# Patient Record
Sex: Female | Born: 2000
Health system: Southern US, Community
[De-identification: ages and names within clinical notes are randomized; demographics above are authoritative.]

## PROBLEM LIST (undated history)

## (undated) DIAGNOSIS — F419 Anxiety disorder, unspecified: Secondary | ICD-10-CM

## (undated) DIAGNOSIS — F909 Attention-deficit hyperactivity disorder, unspecified type: Secondary | ICD-10-CM

## (undated) DIAGNOSIS — R488 Other symbolic dysfunctions: Secondary | ICD-10-CM

## (undated) DIAGNOSIS — R278 Other lack of coordination: Secondary | ICD-10-CM

## (undated) DIAGNOSIS — R519 Headache, unspecified: Secondary | ICD-10-CM

## (undated) DIAGNOSIS — F32A Depression, unspecified: Secondary | ICD-10-CM

## (undated) DIAGNOSIS — R51 Headache: Secondary | ICD-10-CM

## (undated) HISTORY — DX: Headache, unspecified: R51.9

## (undated) HISTORY — PX: TONSILLECTOMY AND ADENOIDECTOMY: SUR1326

## (undated) HISTORY — DX: Anxiety disorder, unspecified: F41.9

## (undated) HISTORY — DX: Other symbolic dysfunctions: R48.8

## (undated) HISTORY — DX: Depression, unspecified: F32.A

## (undated) HISTORY — DX: Other lack of coordination: R27.8

## (undated) HISTORY — PX: APPENDECTOMY: SHX54

## (undated) HISTORY — DX: Headache: R51

## (undated) HISTORY — DX: Attention-deficit hyperactivity disorder, unspecified type: F90.9

---
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
## 2022-01-26 ENCOUNTER — Telehealth: Payer: 59 | Admitting: Physician Assistant

## 2022-01-26 DIAGNOSIS — B9689 Other specified bacterial agents as the cause of diseases classified elsewhere: Secondary | ICD-10-CM

## 2022-01-26 DIAGNOSIS — J019 Acute sinusitis, unspecified: Secondary | ICD-10-CM | POA: Diagnosis not present

## 2022-01-26 MED ORDER — AMOXICILLIN-POT CLAVULANATE 875-125 MG PO TABS: 1.0000 | ORAL_TABLET | Freq: Two times a day (BID) | ORAL | 0 refills | Status: DC

## 2022-01-26 NOTE — Patient Instructions (Signed)
[name], thank you for joining Leeanne Rio, PA-C for today's virtual visit.  While this provider is not your primary care provider (PCP), if your PCP is located in our provider database this encounter information will be shared with them immediately following your visit.  Consent: (Patient) Kaitlyn Rhodes provided verbal consent for this virtual visit at the beginning of the encounter.  Current Medications:  Current Outpatient Medications:    lisdexamfetamine (VYVANSE) 20 MG capsule, Take 1 capsule (20 mg total) by mouth daily with breakfast., Disp: 30 capsule, Rfl: 0   MELATONIN ER PO, Take by mouth daily as needed., Disp: , Rfl:    Medications ordered in this encounter:  No orders of the defined types were placed in this encounter.    *If you need refills on other medications prior to your next appointment, please contact your pharmacy*  Follow-Up: Call back or seek an in-person evaluation if the symptoms worsen or if the condition fails to improve as anticipated.  [place] [phone]  Other Instructions Please take antibiotic as directed.  Increase fluid intake.  Use Saline nasal spray.  Take a daily multivitamin. Ok to continue Mucinex Sinus Max or a Tylenol Cold and Sinus over-the-counter.  Place a humidifier in the bedroom.  Please call or return clinic if symptoms are not improving.  Sinusitis Sinusitis is redness, soreness, and swelling (inflammation) of the paranasal sinuses. Paranasal sinuses are air pockets within the bones of your face (beneath the eyes, the middle of the forehead, or above the eyes). In healthy paranasal sinuses, mucus is able to drain out, and air is able to circulate through them by way of your nose. However, when your paranasal sinuses are inflamed, mucus and air can become trapped. This can allow bacteria and other germs to grow and cause infection. Sinusitis can develop quickly and last only a short time  (acute) or continue over a long period (chronic). Sinusitis that lasts for more than 12 weeks is considered chronic.  CAUSES  Causes of sinusitis include: Allergies. Structural abnormalities, such as displacement of the cartilage that separates your nostrils (deviated septum), which can decrease the air flow through your nose and sinuses and affect sinus drainage. Functional abnormalities, such as when the small hairs (cilia) that line your sinuses and help remove mucus do not work properly or are not present. SYMPTOMS  Symptoms of acute and chronic sinusitis are the same. The primary symptoms are pain and pressure around the affected sinuses. Other symptoms include: Upper toothache. Earache. Headache. Bad breath. Decreased sense of smell and taste. A cough, which worsens when you are lying flat. Fatigue. Fever. Thick drainage from your nose, which often is green and may contain pus (purulent). Swelling and warmth over the affected sinuses. DIAGNOSIS  Your caregiver will perform a physical exam. During the exam, your caregiver may: Look in your nose for signs of abnormal growths in your nostrils (nasal polyps). Tap over the affected sinus to check for signs of infection. View the inside of your sinuses (endoscopy) with a special imaging device with a light attached (endoscope), which is inserted into your sinuses. If your caregiver suspects that you have chronic sinusitis, one or more of the following tests may be recommended: Allergy tests. Nasal culture A sample of mucus is taken from your nose and sent to a lab and screened for bacteria. Nasal cytology A sample of mucus is taken from your nose and examined by your caregiver to determine if your sinusitis  is related to an allergy. TREATMENT  Most cases of acute sinusitis are related to a viral infection and will resolve on their own within 10 days. Sometimes medicines are prescribed to help relieve symptoms (pain medicine,  decongestants, nasal steroid sprays, or saline sprays).  However, for sinusitis related to a bacterial infection, your caregiver will prescribe antibiotic medicines. These are medicines that will help kill the bacteria causing the infection.  Rarely, sinusitis is caused by a fungal infection. In theses cases, your caregiver will prescribe antifungal medicine. For some cases of chronic sinusitis, surgery is needed. Generally, these are cases in which sinusitis recurs more than 3 times per year, despite other treatments. HOME CARE INSTRUCTIONS  Drink plenty of water. Water helps thin the mucus so your sinuses can drain more easily. Use a humidifier. Inhale steam 3 to 4 times a day (for example, sit in the bathroom with the shower running). Apply a warm, moist washcloth to your face 3 to 4 times a day, or as directed by your caregiver. Use saline nasal sprays to help moisten and clean your sinuses. Take over-the-counter or prescription medicines for pain, discomfort, or fever only as directed by your caregiver. SEEK IMMEDIATE MEDICAL CARE IF: You have increasing pain or severe headaches. You have nausea, vomiting, or drowsiness. You have swelling around your face. You have vision problems. You have a stiff neck. You have difficulty breathing. MAKE SURE YOU:  Understand these instructions. Will watch your condition. Will get help right away if you are not doing well or get worse. Document Released: 04/13/2005 Document Revised: 07/06/2011 Document Reviewed: 04/28/2011 [facility] Patient Information 2014 [place], [place].     If you have been instructed to have an in-person evaluation today at a local Urgent Care facility, please use the link below. It will take you to a list of all of our available [facility], including address, phone number and hours of operation. Please do not delay care.  [facility]  If you or a family member do not have a primary care provider,  use the link below to schedule a visit and establish care. When you choose a [facility] primary care physician or advanced practice provider, you gain a long-term partner in health. Find a Primary Care Provider  Learn more about [facility]'s in-office and virtual care options: [facility] - Get Care Now

## 2022-01-26 NOTE — Progress Notes (Signed)
Virtual Visit Consent   Kaitlyn Rhodes, you are scheduled for a virtual visit with a [facility] provider today. Just as with appointments in the office, your consent must be obtained to participate. Your consent will be active for this visit and any virtual visit you may have with one of our providers in the next 365 days. If you have a MyChart account, a copy of this consent can be sent to you electronically.  As this is a virtual visit, video technology does not allow for your provider to perform a traditional examination. This may limit your provider's ability to fully assess your condition. If your provider identifies any concerns that need to be evaluated in person or the need to arrange testing (such as labs, EKG, etc.), we will make arrangements to do so. Although advances in technology are sophisticated, we cannot ensure that it will always work on either your end or our end. If the connection with a video visit is poor, the visit may have to be switched to a telephone visit. With either a video or telephone visit, we are not always able to ensure that we have a secure connection.  By engaging in this virtual visit, you consent to the provision of healthcare and authorize for your insurance to be billed (if applicable) for the services provided during this visit. Depending on your insurance coverage, you may receive a charge related to this service.  I need to obtain your verbal consent now. Are you willing to proceed with your visit today? Kaitlyn Rhodes has provided verbal consent on 01/26/2022 for a virtual visit (video or telephone). Leeanne Rio, [place]  Date: 01/26/2022 3:22 PM  Virtual Visit via Video Note   I, Leeanne Rio, connected with  Kaitlyn Rhodes  ([id number], 07/13/2000) on 01/26/22 at  3:00 PM EDT by a video-enabled telemedicine application and verified that I am speaking with the correct person using two identifiers.  Location: Patient: Virtual  Visit Location Patient: Home Provider: Virtual Visit Location Provider: Home Office   I discussed the limitations of evaluation and management by telemedicine and the availability of in person appointments. The patient expressed understanding and agreed to proceed.    History of Present Illness: Kaitlyn Rhodes is a 21 y.o. who identifies as a female who was assigned female at birth, and is being seen today for progressive URI symptoms since last week. Initially with head congestion, sore throat, cough and fever. Was seen at UC last week and screened for influenza and strep -- both negative. Diagnosed with viral URI and sent home with supportive care instructions. Symptoms have changed. Fever resolved. Still with head congestion, increasing with sinus pressure and ear pressure/pain. Now with some drainage from eyes bilaterally. Tested at home for COVID x 2 -- Last Wednesday and Friday -- both negative.    HPI: HPI  Problems:  Patient Active Problem List   Diagnosis Date Noted   Pyelonephritis of left kidney 11/03/2021   Fever 11/03/2021   Spinal curvature 10/03/2019   Encounter for routine child health examination without abnormal findings 10/03/2019   Anxiety 10/03/2019   Other fatigue 06/17/2018   Lightheaded 09/16/2016   Skin picking habit 08/04/2016   Sensory integration disorder 01/15/2016   Migraine without aura and without status migrainosus, not intractable 11/07/2015   Episodic tension-type headache, not intractable 11/07/2015   Developmental dysgraphia 07/18/2015   Acute nonsuppurative otitis media of both ears 12/20/2013   Acute pharyngitis 12/20/2013   Left foot pain 08/09/2013  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   BMI (body mass index), pediatric, 5% to less than 85% for age 64/05/2013   Anxiety associated with depression 07/27/2013   Viral pharyngitis 05/25/2013   Abdominal pain, suprapubic 08/23/2012   Allergic rhinitis 06/30/2012   Attention deficit  hyperactivity disorder (ADHD), predominantly inattentive type 02/26/2011    Allergies:  Allergies  Allergen Reactions   Codeine Other (See Comments)    Reaction: mother states pt advised not to take codeine due to rapid metabolism    Latex Rash   Medications:  Current Outpatient Medications:    amoxicillin-clavulanate (AUGMENTIN) 875-125 MG tablet, Take 1 tablet by mouth 2 (two) times daily., Disp: 14 tablet, Rfl: 0   lisdexamfetamine (VYVANSE) 20 MG capsule, Take 1 capsule (20 mg total) by mouth daily with breakfast., Disp: 30 capsule, Rfl: 0   MELATONIN ER PO, Take by mouth daily as needed., Disp: , Rfl:   Observations/Objective: Patient is well-developed, well-nourished in no acute distress.  Resting comfortably at home.  Head is normocephalic, atraumatic.  No labored breathing. Speech is clear and coherent with logical content.  Patient is alert and oriented at baseline.   Assessment and Plan: 1. Acute bacterial sinusitis - amoxicillin-clavulanate (AUGMENTIN) 875-125 MG tablet; Take 1 tablet by mouth 2 (two) times daily.  Dispense: 14 tablet; Refill: 0  Rx Augmentin.  Increase fluids.  Rest.  Saline nasal spray.  Probiotic.  Mucinex as directed.  Humidifier in bedroom. OTC medications reviewed.  Call or return to clinic if symptoms are not improving.   Follow Up Instructions: I discussed the assessment and treatment plan with the patient. The patient was provided an opportunity to ask questions and all were answered. The patient agreed with the plan and demonstrated an understanding of the instructions.  A copy of instructions were sent to the patient via MyChart unless otherwise noted below.   The patient was advised to call back or seek an in-person evaluation if the symptoms worsen or if the condition fails to improve as anticipated.  Time:  I spent 8 minutes with the patient via telehealth technology discussing the above problems/concerns.    Piedad Climes, PA-C

## 2022-02-11 ENCOUNTER — Encounter (HOSPITAL_BASED_OUTPATIENT_CLINIC_OR_DEPARTMENT_OTHER): Payer: Self-pay | Admitting: Emergency Medicine

## 2022-02-11 ENCOUNTER — Other Ambulatory Visit: Payer: Self-pay

## 2022-02-11 ENCOUNTER — Emergency Department (HOSPITAL_BASED_OUTPATIENT_CLINIC_OR_DEPARTMENT_OTHER): Payer: 59 | Admitting: Radiology

## 2022-02-11 DIAGNOSIS — Z20822 Contact with and (suspected) exposure to covid-19: Secondary | ICD-10-CM | POA: Diagnosis not present

## 2022-02-11 DIAGNOSIS — Z91040 Latex allergy status: Secondary | ICD-10-CM | POA: Insufficient documentation

## 2022-02-11 DIAGNOSIS — R0789 Other chest pain: Secondary | ICD-10-CM | POA: Diagnosis present

## 2022-02-11 LAB — CBC
HCT: 39.9 % (ref 36.0–46.0)
Hemoglobin: 13.7 g/dL (ref 12.0–15.0)
MCH: 28.7 pg (ref 26.0–34.0)
MCHC: 34.3 g/dL (ref 30.0–36.0)
MCV: 83.5 fL (ref 80.0–100.0)
Platelets: 274 10*3/uL (ref 150–400)
RBC: 4.78 MIL/uL (ref 3.87–5.11)
RDW: 12.4 % (ref 11.5–15.5)
WBC: 7.4 10*3/uL (ref 4.0–10.5)
nRBC: 0 % (ref 0.0–0.2)

## 2022-02-11 LAB — BASIC METABOLIC PANEL
Anion gap: 10 (ref 5–15)
BUN: 18 mg/dL (ref 6–20)
CO2: 26 mmol/L (ref 22–32)
Calcium: 9.9 mg/dL (ref 8.9–10.3)
Chloride: 104 mmol/L (ref 98–111)
Creatinine, Ser: 0.55 mg/dL (ref 0.44–1.00)
GFR, Estimated: >60 mL/min (ref >60)
Glucose, Bld: 83 mg/dL (ref 70–99)
Potassium: 3.5 mmol/L (ref 3.5–5.1)
Sodium: 140 mmol/L (ref 135–145)

## 2022-02-11 LAB — RESP PANEL BY RT-PCR (FLU A&B, COVID) ARPGX2
Influenza A by PCR: NEGATIVE
Influenza B by PCR: NEGATIVE
SARS Coronavirus 2 by RT PCR: NEGATIVE

## 2022-02-11 LAB — TROPONIN I (HIGH SENSITIVITY): Troponin I (High Sensitivity): <2 ng/L (ref <18)

## 2022-02-11 NOTE — ED Triage Notes (Addendum)
Right sided chest pain. "Rib under my right breast hurts" Started last night worse today  Also reports cough and congestion  Ibuprofen taken at 5pm

## 2022-02-12 ENCOUNTER — Emergency Department (HOSPITAL_BASED_OUTPATIENT_CLINIC_OR_DEPARTMENT_OTHER)
Admission: EM | Admit: 2022-02-12 | Discharge: 2022-02-12 | Disposition: A | Discharge status: Home / Self Care | Payer: 59 | Attending: Emergency Medicine | Admitting: Emergency Medicine

## 2022-02-12 DIAGNOSIS — R0789 Other chest pain: Secondary | ICD-10-CM

## 2022-02-12 LAB — PREGNANCY, URINE: Preg Test, Ur: NEGATIVE

## 2022-02-12 NOTE — Discharge Instructions (Signed)
Tylenol and ibuprofen for pain.  Please follow-up with your family doc in the office.  Please return for coughing fever or if you feel you are in a pass out

## 2022-02-12 NOTE — ED Provider Notes (Signed)
[facility] EMERGENCY DEPT Provider Note   CSN: [id number] Arrival date & time: 02/11/22  2148     History  Chief Complaint  Patient presents with   Chest Pain    Jeanann Balinski is a 21 y.o. female.  21 yo F with a chief complaint of right-sided chest pain.  This been going on for a few days now.  The patient has been sick with an upper respiratory illness was initially thought to be viral and then her PCP thought that it turned bacterial and was started on amoxicillin.  Since then her symptoms had resolved but she has a bit of a persistent cough.  Chest pain is sharp is worse with coughing movement or palpation.  Denies trauma to the area.    She denies history of MI.  Denies family history of MI.  Denies history of PE or DVT denies hemoptysis denies unilateral lower extremity edema denies recent surgery immobilization or hospitalization denies history of cancer denies estrogen use.   Chest Pain      Home Medications Prior to Admission medications   Medication Sig Start Date End Date Taking? Authorizing Provider  amoxicillin-clavulanate (AUGMENTIN) 875-125 MG tablet Take 1 tablet by mouth 2 (two) times daily. 01/26/22   Waldon Merl, PA-C  lisdexamfetamine (VYVANSE) 20 MG capsule Take 1 capsule (20 mg total) by mouth daily with breakfast. 01/30/21   Crump, Priscille Loveless A, NP  MELATONIN ER PO Take by mouth daily as needed.    [provider]      Allergies    Codeine and Latex    Review of Systems   Review of Systems  Cardiovascular:  Positive for chest pain.    Physical Exam Updated Vital Signs BP 110/68 (BP Location: Right Arm)   Pulse 74   Temp 98.5 F (36.9 C) (Oral)   Resp 18   LMP 01/14/2022 (Exact Date)   SpO2 100%  Physical Exam Vitals and nursing note reviewed.  Constitutional:      General: She is not in acute distress.    Appearance: She is well-developed. She is not diaphoretic.  HENT:     Head: Normocephalic and  atraumatic.  Eyes:     Pupils: Pupils are equal, round, and reactive to light.  Cardiovascular:     Rate and Rhythm: Normal rate and regular rhythm.     Heart sounds: No murmur heard.    No friction rub. No gallop.  Pulmonary:     Effort: Pulmonary effort is normal.     Breath sounds: No wheezing or rales.  Chest:     Chest wall: Tenderness present.     Comments: Pain about the right chest wall about ribs 5 through 7 about the midclavicular line reproduce the patient's discomfort Abdominal:     General: There is no distension.     Palpations: Abdomen is soft.     Tenderness: There is no abdominal tenderness.  Musculoskeletal:        General: No tenderness.     Cervical back: Normal range of motion and neck supple.  Skin:    General: Skin is warm and dry.  Neurological:     Mental Status: She is alert and oriented to person, place, and time.  Psychiatric:        Behavior: Behavior normal.     ED Results / Procedures / Treatments   Labs (all labs ordered are listed, but only abnormal results are displayed) Labs Reviewed  RESP PANEL BY RT-PCR (FLU  A&B, COVID) ARPGX2  BASIC METABOLIC PANEL  CBC  PREGNANCY, URINE  TROPONIN I (HIGH SENSITIVITY)  TROPONIN I (HIGH SENSITIVITY)    EKG EKG Interpretation  Date/Time:  Wednesday February 11 2022 22:00:57 EDT Ventricular Rate:  87 PR Interval:  136 QRS Duration: 80 QT Interval:  360 QTC Calculation: 433 R Axis:   92 Text Interpretation: Normal sinus rhythm Rightward axis Borderline ECG No previous ECGs available Confirmed by Georgina Snell [phone]) on 02/11/2022 10:02:41 PM  Radiology DG Chest 2 View  Result Date: 02/11/2022 CLINICAL DATA:  Right-sided chest pain EXAM: CHEST - 2 VIEW COMPARISON:  None Available. FINDINGS: The heart size and mediastinal contours are within normal limits. Both lungs are clear. The visualized skeletal structures are unremarkable. IMPRESSION: No active cardiopulmonary disease. Electronically  Signed   By: Placido Sou M.D.   On: 02/11/2022 22:48    Procedures Procedures    Medications Ordered in ED Medications - No data to display  ED Course/ Medical Decision Making/ A&P                           Medical Decision Making Amount and/or Complexity of Data Reviewed Labs: ordered. Radiology: ordered.   21 yo F with a chief complaints of right-sided chest pain.  This been going on for couple days.  Occurred in the context of coughing from an upper respiratory illness.  Reproduced on exam.  Completely atypical from ACS.  She had a work-up ordered in the triage process.  She had a troponin that was negative EKG without signs of ischemia chest x-ray independently interpreted by me without focal infiltrate or pneumothorax.  Her symptoms are completely atypical of pulmonary embolism and I feel no further work-up is needed.  We will treat as musculoskeletal.  PCP follow-up.  2:20 AM:  I have discussed the diagnosis/risks/treatment options with the patient and family.  Evaluation and diagnostic testing in the emergency department does not suggest an emergent condition requiring admission or immediate intervention beyond what has been performed at this time.  They will follow up with PCP. We also discussed returning to the ED immediately if new or worsening sx occur. We discussed the sx which are most concerning (e.g., sudden worsening pain, fever, inability to tolerate by mouth) that necessitate immediate return. Medications administered to the patient during their visit and any new prescriptions provided to the patient are listed below.  Medications given during this visit Medications - No data to display   The patient appears reasonably screen and/or stabilized for discharge and I doubt any other medical condition or other [facility] requiring further screening, evaluation, or treatment in the ED at this time prior to discharge.          Final Clinical Impression(s) / ED  Diagnoses Final diagnoses:  Chest wall pain    Rx / DC Orders ED Discharge Orders     None         Deno Etienne, DO 02/12/22 0220

## 2022-08-20 ENCOUNTER — Encounter: Payer: Self-pay | Admitting: Pediatrics

## 2022-08-20 ENCOUNTER — Ambulatory Visit
Admission: RE | Admit: 2022-08-20 | Discharge: 2022-08-20 | Disposition: A | Discharge status: Home / Self Care | Payer: 59 | Source: Ambulatory Visit | Attending: Pediatrics | Admitting: Pediatrics

## 2022-08-20 ENCOUNTER — Ambulatory Visit (INDEPENDENT_AMBULATORY_CARE_PROVIDER_SITE_OTHER): Payer: 59 | Admitting: Pediatrics

## 2022-08-20 VITALS — HR 91 | Wt 160.0 lb

## 2022-08-20 DIAGNOSIS — J40 Bronchitis, not specified as acute or chronic: Secondary | ICD-10-CM

## 2022-08-20 DIAGNOSIS — R053 Chronic cough: Secondary | ICD-10-CM

## 2022-08-20 MED ORDER — PREDNISONE 20 MG PO TABS: 20.0000 mg | ORAL_TABLET | Freq: Two times a day (BID) | ORAL | 0 refills | Status: AC

## 2022-08-20 MED ORDER — HYDROXYZINE HCL 25 MG PO TABS: 25.0000 mg | ORAL_TABLET | Freq: Every evening | ORAL | 0 refills | Status: AC | PRN

## 2022-08-20 NOTE — Patient Instructions (Signed)
Chronic Cough Coughing is a reflex that clears your throat and airways (respiratory system). It helps heal and protect your lungs. It is normal to cough from time to time. A cough that happens with other symptoms or that lasts a long time may be a sign of a condition that needs treatment. A long-term (chronic) cough may last 8 or more weeks. There are two types of chronic cough: A symptomatic chronic cough. This is caused by a disease that can be found and treated. A refractory chronic cough. This is a cough that does not go away with testing and treatment. A chronic cough may be caused by: Long-term lung diseases. These include chronic obstructive pulmonary disease (COPD), asthma, and pulmonary fibrosis. Upper airway problems. These include allergies, sinusitis, and gastric reflux. Some medicines. Smoking. Follow these instructions at home: Medicines Take over-the-counter and prescription medicines only as told by your health care provider. Ask your provider about getting a flu (influenza) or pneumonia vaccine. Managing a sore or dry throat If your throat is sore or dry, gargle with a mixture of salt and water 3-4 times a day or as needed. To make salt water, completely dissolve -1 tsp (3-6 g) of salt in 1 cup (237 mL) of warm water. Soothe your throat with a cough drop or honey. A dry throat may make your cough worse. Use a cool mist vaporizer at home to add moisture to the air. Lifestyle Avoid cigarette smoke. Do not use any products that contain nicotine or tobacco. These products include cigarettes, chewing tobacco, and vaping devices, such as e-cigarettes. If you need help quitting, ask your provider. Avoid things that may irritate your throat or trigger your allergies. General instructions  Drink enough fluid to keep your pee (urine) pale yellow. Always cover your mouth when you cough. Stay away from people who are sick. Getting a cold or the flu can make your cough worse. Wash your  hands often with soap and water for at least 20 seconds. If soap and water are not available, use hand sanitizer. Contact a health care provider if: Your cough gets worse. You have a fever or chills. You are short of breath. Get help right away if: You have trouble breathing. You have chest pain. These symptoms may be an emergency. Get help right away. Call 911. Do not wait to see if the symptoms will go away. Do not drive yourself to the hospital. This information is not intended to replace advice given to you by your health care provider. Make sure you discuss any questions you have with your health care provider. Document Revised: 12/25/2021 Document Reviewed: 12/25/2021 Elsevier Patient Education  2023 [employer].

## 2022-08-20 NOTE — Progress Notes (Signed)
  History provided by patient.  Kaitlyn Rhodes is an 22 y.o. female who presents persistent cough for the last month. Cough has gotten barky and deep. Patient states she's used Mucinex DM, Xyzal, Robitussin and Tessalon. [facility] and diagnosed with acute bronchitis. Tessalon was given at that time. Has not had any ear pain, fevers, increased work of breathing, wheezing, vomiting, diarrhea, rashes, sore throat. No real congestion or facial tenderness. Patient has had some pain with coughing. Has used albuterol inhaler at home intermittently with mild relief. Known reaction to codeine. No known sick contacts.  The following portions of the patient's history were reviewed and updated as appropriate: allergies, current medications, past family history, past medical history, past social history, past surgical history, and problem list.  Review of Systems  Constitutional:  Negative for chills, positive for activity change and appetite change.  HENT:  Negative for  trouble swallowing, voice change and ear discharge.   Eyes: Negative for discharge, redness and itching.  Respiratory:  Negative for  wheezing.   Cardiovascular: Negative for chest pain.  Gastrointestinal: Negative for vomiting and diarrhea.  Musculoskeletal: Negative for arthralgias.  Skin: Negative for rash.  Neurological: Negative for weakness.        Objective:   Vitals:   08/20/22 0923  Pulse: 91  SpO2: 99%    Physical Exam  Constitutional: Appears well-developed and well-nourished.   HENT:  Ears: Both TM's normal Nose: No nasal discharge.  Mouth/Throat: Mucous membranes are moist. No dental caries. No tonsillar exudate. Pharynx is normal..  Eyes: Pupils are equal, round, and reactive to light.  Neck: Normal range of motion..  Cardiovascular: Regular rhythm.  No murmur heard. Pulmonary/Chest: Effort normal and slight generalized diminished breath sounds. Breath sounds clear. No  nasal flaring. No respiratory distress. No wheezes with  no retractions.  Abdominal: Soft. Bowel sounds are normal. No distension and no tenderness.  Musculoskeletal: Normal range of motion.  Neurological: Active and alert.  Skin: Skin is warm and moist. No rash noted.  Lymph: Negative for anterior and posterior cervical lympadenopathy.  IMAGING: FINDINGS: No consolidation, pneumothorax or effusion. No edema. Normal cardiopericardial silhouette. Slight curvature of the thoracic spine.   IMPRESSION: No acute cardiopulmonary disease     Assessment:      Persistent cough in pediatric patient Bronchitis in pediatric patient  Plan:  Prednisone as ordered for bronchitis Hydroxyzine as ordered for associated cough/congestion  Symptomatic care for cough and congestion management Increase fluid intake Return precautions provided Follow-up as needed for symptoms that worsen/fail to improve  Meds ordered this encounter  Medications   predniSONE (DELTASONE) 20 MG tablet    Sig: Take 1 tablet (20 mg total) by mouth 2 (two) times daily for 5 days.    Dispense:  10 tablet    Refill:  0   hydrOXYzine (ATARAX) 25 MG tablet    Sig: Take 1 tablet (25 mg total) by mouth at bedtime as needed for up to 7 days.    Dispense:  7 tablet    Refill:  0

## 2022-10-15 ENCOUNTER — Ambulatory Visit: Payer: 59 | Admitting: Nurse Practitioner

## 2022-10-15 ENCOUNTER — Ambulatory Visit (INDEPENDENT_AMBULATORY_CARE_PROVIDER_SITE_OTHER): Payer: 59 | Admitting: Nurse Practitioner

## 2022-10-15 ENCOUNTER — Telehealth: Payer: Self-pay | Admitting: Nurse Practitioner

## 2022-10-15 VITALS — BP 96/62 | HR 95 | Temp 98.2°F | Ht 67.5 in | Wt 163.2 lb

## 2022-10-15 DIAGNOSIS — E663 Overweight: Secondary | ICD-10-CM

## 2022-10-15 DIAGNOSIS — Z131 Encounter for screening for diabetes mellitus: Secondary | ICD-10-CM

## 2022-10-15 DIAGNOSIS — R053 Chronic cough: Secondary | ICD-10-CM

## 2022-10-15 DIAGNOSIS — F32A Depression, unspecified: Secondary | ICD-10-CM | POA: Diagnosis not present

## 2022-10-15 DIAGNOSIS — Z113 Encounter for screening for infections with a predominantly sexual mode of transmission: Secondary | ICD-10-CM | POA: Diagnosis not present

## 2022-10-15 DIAGNOSIS — Z13220 Encounter for screening for lipoid disorders: Secondary | ICD-10-CM

## 2022-10-15 DIAGNOSIS — F419 Anxiety disorder, unspecified: Secondary | ICD-10-CM | POA: Diagnosis not present

## 2022-10-15 LAB — COMPREHENSIVE METABOLIC PANEL
ALT: 13 U/L (ref 0–35)
AST: 18 U/L (ref 0–37)
Albumin: 4.3 g/dL (ref 3.5–5.2)
Alkaline Phosphatase: 85 U/L (ref 39–117)
BUN: 15 mg/dL (ref 6–23)
CO2: 25 mEq/L (ref 19–32)
Calcium: 9.4 mg/dL (ref 8.4–10.5)
Chloride: 103 mEq/L (ref 96–112)
Creatinine, Ser: 0.63 mg/dL (ref 0.40–1.20)
GFR: 128 mL/min (ref >60.00)
Glucose, Bld: 89 mg/dL (ref 70–99)
Potassium: 4.2 mEq/L (ref 3.5–5.1)
Sodium: 137 mEq/L (ref 135–145)
Total Bilirubin: 0.5 mg/dL (ref 0.2–1.2)
Total Protein: 7.8 g/dL (ref 6.0–8.3)

## 2022-10-15 LAB — HEMOGLOBIN A1C: Hgb A1c MFr Bld: 5 % (ref 4.6–6.5)

## 2022-10-15 LAB — CBC
HCT: 40.5 % (ref 36.0–46.0)
Hemoglobin: 13.7 g/dL (ref 12.0–15.0)
MCHC: 33.8 g/dL (ref 30.0–36.0)
MCV: 83 fl (ref 78.0–100.0)
Platelets: 263 10*3/uL (ref 150.0–400.0)
RBC: 4.88 Mil/uL (ref 3.87–5.11)
RDW: 13 % (ref 11.5–14.6)
WBC: 6.4 10*3/uL (ref 4.5–10.5)

## 2022-10-15 LAB — LIPID PANEL
Cholesterol: 143 mg/dL (ref 0–200)
HDL: 46.3 mg/dL (ref >39.00)
LDL Cholesterol: 81 mg/dL (ref 0–99)
NonHDL: 96.35
Total CHOL/HDL Ratio: 3
Triglycerides: 79 mg/dL (ref 0.0–149.0)
VLDL: 15.8 mg/dL (ref 0.0–40.0)

## 2022-10-15 LAB — TSH: TSH: 1.24 u[IU]/mL (ref 0.35–5.50)

## 2022-10-15 NOTE — Progress Notes (Signed)
New Patient Office Visit  Subjective    Patient ID: Kaitlyn Rhodes, female    DOB: Dec 17, 2000  Age: 22 y.o. MRN: [id number]  CC:  Chief Complaint  Patient presents with   New Patient (Initial Visit)    New patient, establish care and want a general check up    HPI Kaitlyn Rhodes presents to establish care. Her main concern today is fatigue and cough. Reports she has had intermittent fatigue chronically is also associated chills.  She reports history of ADHD as well as anxiety and possibly depression.  PHQ-9 score today is elevated at 15.  She tells me she has been on an antidepressant in the past but cannot recall the name of it.  Has not been on any for a few years denies suicidal ideation today. Reports chronic cough that has been on and off for the last 3 months.  She vaguely remembers childhood history of asthma.  She does have albuterol inhaler as well as Jerilynn Som that she has been taking as needed for her cough.  She underwent chest x-ray about 2 months ago which did not identify any abnormalities.  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  She denies smoking or vaping.  Outpatient Encounter Medications as of 10/15/2022  Medication Sig   albuterol (VENTOLIN HFA) 108 (90 Base) MCG/ACT inhaler Inhale 1-2 puffs into the lungs every 6 (six) hours as needed for wheezing or shortness of breath.   MELATONIN ER PO Take by mouth daily as needed. (Patient not taking: Reported on 10/15/2022)   [DISCONTINUED] amoxicillin-clavulanate (AUGMENTIN) 875-125 MG tablet Take 1 tablet by mouth 2 (two) times daily.   [DISCONTINUED] lisdexamfetamine (VYVANSE) 20 MG capsule Take 1 capsule (20 mg total) by mouth daily with breakfast.   No facility-administered encounter medications on file as of 10/15/2022.    Past Medical History:  Diagnosis Date   ADHD  (attention deficit hyperactivity disorder)    Anxiety    [employer] 10/03/2019   Anxiety disorder    Depression    [employer] 10/03/2019   Developmental dysgraphia    Headache     Past Surgical History:  Procedure Laterality Date   APPENDECTOMY  Age 40.5   TONSILLECTOMY AND ADENOIDECTOMY  Age 75    Family History  Problem Relation Age of Onset   Schizophrenia Paternal Grandmother    Asperger's syndrome Sister    Depression Sister    Anxiety disorder Sister    Epilepsy Sister    ADD / ADHD Mother    Anxiety disorder Mother    Depression Mother    Depression Maternal Aunt    Learning disabilities Maternal Aunt    Depression Maternal Grandmother    Learning disabilities Maternal Grandfather    Depression Maternal Grandfather    Cancer Maternal Grandfather        lung   Autism Cousin    Depression Father    Anxiety disorder Father    Heart disease Paternal Grandfather     Social History   Socioeconomic History   Marital status: Single    Spouse name: Not on file   Number of children: Not on file   Years of education: Not on file   Highest education level: Not on file  Occupational History   Not on file  Tobacco Use   Smoking status: Never   Smokeless  tobacco: Never  Vaping Use   Vaping Use: Never used  Substance and Sexual Activity   Alcohol use: No    Alcohol/week: 0.0 standard drinks of alcohol   Drug use: No   Sexual activity: Never  Other Topics Concern   Not on file  Social History Narrative   12th grade at [employer]   She lives with both parents, grandfather, older sister.   She enjoys sleeping, art, and video games   [name] has OT once weekly for 45 minutes.   Social Determinants of Health   Financial Resource Strain: Not on file  Food Insecurity: Not on file  Transportation Needs: Not on file  Physical Activity: Not on file  Stress: Not on file  Social Connections: Not on file  Intimate Partner Violence: Not on file    Review of  Systems  Constitutional:  Positive for chills and malaise/fatigue. Negative for fever and weight loss.  Respiratory:  Positive for cough and sputum production. Negative for shortness of breath and wheezing.   Cardiovascular:  Negative for chest pain and palpitations.  Gastrointestinal:  Negative for abdominal pain and blood in stool.  Genitourinary:  Negative for hematuria.  Skin:  Positive for rash.  Neurological:  Negative for seizures and loss of consciousness.  Psychiatric/Behavioral:  Positive for depression. Negative for suicidal ideas. The patient is nervous/anxious.       10/15/2022    1:26 PM 12/23/2020    3:43 PM 02/13/2020    4:42 PM  PHQ9 SCORE ONLY  PHQ-9 Total Score 15       Information is confidential and restricted. Go to Review Flowsheets to unlock data.        Objective    BP 96/62   Pulse 95   Temp 98.2 F (36.8 C) (Temporal)   Ht 5' 7.5" (1.715 m)   Wt 163 lb 4 oz (74 kg)   LMP 09/28/2022   SpO2 98%   BMI 25.19 kg/m   Physical Exam Vitals reviewed.  Constitutional:      General: She is not in acute distress.    Appearance: Normal appearance.  HENT:     Head: Normocephalic and atraumatic.  Neck:     Vascular: No carotid bruit.  Cardiovascular:     Rate and Rhythm: Normal rate and regular rhythm.     Pulses: Normal pulses.     Heart sounds: Normal heart sounds.  Pulmonary:     Effort: Pulmonary effort is normal.     Breath sounds: Normal breath sounds.  Skin:    General: Skin is warm and dry.  Neurological:     General: No focal deficit present.     Mental Status: She is alert and oriented to person, place, and time.  Psychiatric:        Mood and Affect: Mood normal.        Behavior: Behavior normal.        Judgment: Judgment normal.         Assessment & Plan:   Problem List Items Addressed This Visit       Other   Chronic cough - Primary    Chronic Wondering if there may be an underlying mild asthma component.  She was  encouraged to try albuterol inhaler prior to her next work shift to see if this results in less coughing at work.  We did discuss referral to pulmonology for further evaluation but for now patient would like to defer this and discuss referral with  her mom before making a decision.  Patient to follow-up in 1 month.      Relevant Orders   CBC   Comprehensive metabolic panel   Hemoglobin A1c   Lipid panel   TSH   HIV Antibody (routine testing w rflx)   RPR   Chlamydia/Neisseria Gonorrhoeae RNA,TMA,Urogenital   Anxiety and depression    Chronic, suboptimally controlled.  Current PHQ-9 score 15. Will order labs for further evaluation, patient will follow-up in 1 month at which point may discuss pharmacological treatment.  Wellbutrin may be a good option for her as she also has ADHD.  Plan to discuss further at follow-up.      Relevant Orders   CBC   Comprehensive metabolic panel   Hemoglobin A1c   Lipid panel   TSH   HIV Antibody (routine testing w rflx)   RPR   Chlamydia/Neisseria Gonorrhoeae RNA,TMA,Urogenital   Overweight    Labs ordered, further recommendations may be made based upon his results        Relevant Orders   CBC   Comprehensive metabolic panel   Hemoglobin A1c   Lipid panel   TSH   HIV Antibody (routine testing w rflx)   RPR   Chlamydia/Neisseria Gonorrhoeae RNA,TMA,Urogenital   Routine screening for STI (sexually transmitted infection)    Labs ordered, further recommendations may be made based upon his results       Relevant Orders   HIV Antibody (routine testing w rflx)   RPR   Chlamydia/Neisseria Gonorrhoeae RNA,TMA,Urogenital    Return in about 1 month (around 11/14/2022) for CPE with Maralyn Sago.   Elenore Paddy, NP

## 2022-10-15 NOTE — Telephone Encounter (Signed)
Please call patient let her know that I realized after she left today we did not discuss her skin lesion that she mentioned.  If she feels that the lesion has been mainly stable in size we can discuss it further at next office visit.  Because she mention she fell like it was getting slightly bigger over the last few weeks, if she would like to we can do a virtual visit before her upcoming follow-up to discuss this further.

## 2022-10-15 NOTE — Assessment & Plan Note (Signed)
Chronic, suboptimally controlled.  Current PHQ-9 score 15. Will order labs for further evaluation, patient will follow-up in 1 month at which point may discuss pharmacological treatment.  Wellbutrin may be a good option for her as she also has ADHD.  Plan to discuss further at follow-up.

## 2022-10-15 NOTE — Assessment & Plan Note (Signed)
Labs ordered, further recommendations may be made based upon his results. 

## 2022-10-15 NOTE — Assessment & Plan Note (Signed)
Chronic Wondering if there may be an underlying mild asthma component.  She was encouraged to try albuterol inhaler prior to her next work shift to see if this results in less coughing at work.  We did discuss referral to pulmonology for further evaluation but for now patient would like to defer this and discuss referral with her mom before making a decision.  Patient to follow-up in 1 month.

## 2022-10-16 LAB — RPR: RPR Ser Ql: NONREACTIVE

## 2022-10-16 LAB — HIV ANTIBODY (ROUTINE TESTING W REFLEX): HIV 1&2 Ab, 4th Generation: NONREACTIVE

## 2022-10-16 NOTE — Telephone Encounter (Signed)
Left pt voicemail to call back

## 2022-10-19 LAB — CHLAMYDIA/NEISSERIA GONORRHOEAE RNA,TMA,UROGENTIAL
C. trachomatis RNA, TMA: NOT DETECTED
N. gonorrhoeae RNA, TMA: NOT DETECTED

## 2022-12-03 ENCOUNTER — Ambulatory Visit (INDEPENDENT_AMBULATORY_CARE_PROVIDER_SITE_OTHER): Payer: 59 | Admitting: Nurse Practitioner

## 2022-12-03 VITALS — HR 89 | Temp 98.6°F | Ht 67.5 in | Wt 165.0 lb

## 2022-12-03 DIAGNOSIS — Z23 Encounter for immunization: Secondary | ICD-10-CM

## 2022-12-03 DIAGNOSIS — Z0000 Encounter for general adult medical examination without abnormal findings: Secondary | ICD-10-CM

## 2022-12-03 DIAGNOSIS — F419 Anxiety disorder, unspecified: Secondary | ICD-10-CM

## 2022-12-03 DIAGNOSIS — R053 Chronic cough: Secondary | ICD-10-CM | POA: Diagnosis not present

## 2022-12-03 DIAGNOSIS — F909 Attention-deficit hyperactivity disorder, unspecified type: Secondary | ICD-10-CM

## 2022-12-03 DIAGNOSIS — F32A Depression, unspecified: Secondary | ICD-10-CM

## 2022-12-03 DIAGNOSIS — Z0001 Encounter for general adult medical examination with abnormal findings: Secondary | ICD-10-CM | POA: Insufficient documentation

## 2022-12-03 MED ORDER — BUPROPION HCL ER (XL) 150 MG PO TB24: 150.0000 mg | ORAL_TABLET | Freq: Every day | ORAL | 1 refills | Status: DC

## 2022-12-03 NOTE — Assessment & Plan Note (Addendum)
Per review of immunization database patient has not had HPV vaccines.  Patient would like to have this series administered.  First HPV vaccine given today, she will be due for second vaccine in 1 to 2 months, and last vaccine to be administered in 6 months. VIS provided.

## 2022-12-03 NOTE — Assessment & Plan Note (Signed)
Chronic Patient would like to hold off on possibly reinitiating Vyvanse.  I told her if she would like to pursue this I can refer her to psychiatry.  We will be using Wellbutrin for treatment of her anxiety and depression which may also help with her ADHD.  She would like to see how she responds to Wellbutrin before referral to psychiatry is made.

## 2022-12-03 NOTE — Assessment & Plan Note (Signed)
Chronic current PHQ-9 score 15. Per shared decision making patient will trial Wellbutrin 150 mg daily. Patient was warned of suicidal ideation, seizures, tremors, insomnia, and given handout regarding additional potential side effects. Patient told to notify myself if these occur. Follow-up in 6 weeks or sooner as needed.

## 2022-12-03 NOTE — Assessment & Plan Note (Signed)
Improved with use of albuterol as needed. Offered referral to pulmonology for pulmonary function testing to rule out asthma, patient would like to hold off on this for now and will continue using albuterol as needed as well as Zyrtec as needed. Patient encouraged to let me know if cough, wheezing, shortness of breath worsens in any way which point had highly recommend referral to pulmonology.  She reports understanding.

## 2022-12-03 NOTE — Progress Notes (Signed)
Complete physical exam  Patient: Kaitlyn Rhodes   DOB: [birth year]   22 y.o. Female  MRN: [id number]  Subjective:    Chief Complaint  Patient presents with   Annual Exam    Kaitlyn Rhodes is a 22 y.o. female who presents today for a complete physical exam. She reports consuming a general diet.  Exercise: walks frequently, about ~5 miles at work  She generally feels well. She reports sleeping well. She does have additional problems to discuss today.   Anxiety associate with depression and ADHD: We briefly discussed her anxiety and ADHD at last office visit.  She has thought about treatment options and would like to discuss possibly starting Wellbutrin.  Denies any personal history of seizures and is not having suicidal ideation currently.  Has taken Vyvanse and sertraline in the past with good results.  Reports that she is currently sexually active, uses condoms for contraception.  Has not missed a menstrual cycle, last menstrual period was about 1 week ago.   Most recent fall risk assessment:    12/03/2022    9:01 AM  Fall Risk   Falls in the past year? 0  Number falls in past yr: 0  Injury with Fall? 0  Risk for fall due to : No Fall Risks  Follow up Falls evaluation completed     Most recent depression screenings:    12/03/2022    9:01 AM 10/15/2022    1:26 PM  PHQ 2/9 Scores  PHQ - 2 Score 3 2  PHQ- 9 Score 15 15    Vision:Within last year and Dental: No current dental problems and Receives regular dental care  Patient Active Problem List   Diagnosis Date Noted   Attention deficit hyperactivity disorder (ADHD) 12/03/2022   Need for vaccination 12/03/2022   Encounter for general adult medical examination with abnormal findings 12/03/2022   Chronic cough 10/15/2022   Anxiety and depression 10/15/2022   Routine screening for STI (sexually transmitted infection) 10/15/2022   Pyelonephritis of left kidney 11/03/2021   Spinal curvature 10/03/2019   Anxiety  10/03/2019   Sensory integration disorder 01/15/2016   Migraine without aura and without status migrainosus, not intractable 11/07/2015   Episodic tension-type headache, not intractable 11/07/2015   Developmental dysgraphia 07/18/2015   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   Anxiety associated with depression 07/27/2013   Allergic rhinitis 06/30/2012   Attention deficit hyperactivity disorder (ADHD), predominantly inattentive type 02/26/2011   Past Medical History:  Diagnosis Date   ADHD (attention deficit hyperactivity disorder)    Anxiety    [employer]reesia 10/03/2019   Anxiety disorder    Depression    [employer]rees[employer] 10/03/2019   Developmental dysgraphia    Headache    Past Surgical History:  Procedure Laterality Date   APPENDECTOMY  Age 27.5   TONSILLECTOMY AND ADENOIDECTOMY  Age 44   Social History   Socioeconomic History   Marital status: Single    Spouse name: Not on file   Number of children: Not on file   Years of education: Not on file   Highest education level: Not on file  Occupational History   Not on file  Tobacco Use   Smoking status: Never   Smokeless tobacco: Never  Vaping Use   Vaping status: Never Used  Substance and Sexual Activity   Alcohol use: No    Alcohol/week: 0.0 standard drinks of alcohol   Drug use: No   Sexual activity: Never  Other Topics Concern   Not  on file  Social History Narrative   12th grade at [employer]   She lives with both parents, grandfather, older sister.   She enjoys sleeping, art, and video games   [name] has OT once weekly for 45 minutes.   Social Determinants of Health   Financial Resource Strain: Not on file  Food Insecurity: Not on file  Transportation Needs: Not on file  Physical Activity: Not on file  Stress: Not on file  Social Connections: Not on file  Intimate Partner Violence: Not on file   Family History  Problem Relation Age of Onset   Schizophrenia Paternal Grandmother    Asperger's syndrome  Sister    Depression Sister    Anxiety disorder Sister    Epilepsy Sister    ADD / ADHD Mother    Anxiety disorder Mother    Depression Mother    Depression Maternal Aunt    Learning disabilities Maternal Aunt    Depression Maternal Grandmother    Learning disabilities Maternal Grandfather    Depression Maternal Grandfather    Cancer Maternal Grandfather        lung   Autism Cousin    Depression Father    Anxiety disorder Father    Heart disease Paternal Grandfather    Allergies  Allergen Reactions   Codeine Other (See Comments)    Reaction: mother states pt advised not to take codeine due to rapid metabolism    Latex Rash      Patient Care Team: Elenore Paddy, NP as PCP - General (Nurse Practitioner) Pediatrics, [facility] (Pediatrics)   Outpatient Medications Prior to Visit  Medication Sig   albuterol (VENTOLIN HFA) 108 (90 Base) MCG/ACT inhaler Inhale 1-2 puffs into the lungs every 6 (six) hours as needed for wheezing or shortness of breath.   MELATONIN ER PO Take by mouth daily as needed.   No facility-administered medications prior to visit.    Review of Systems  Constitutional:  Negative for chills, diaphoresis, fever and weight loss.  HENT:  Negative for congestion and sinus pain.   Eyes:  Negative for blurred vision and double vision.  Respiratory:  Positive for cough, shortness of breath and wheezing.   Cardiovascular:  Negative for chest pain and palpitations.  Gastrointestinal:  Negative for blood in stool, constipation, diarrhea, nausea and vomiting.  Genitourinary:  Negative for hematuria.  Skin:  Negative for itching and rash.  Neurological:  Positive for dizziness and headaches. Negative for seizures and loss of consciousness.          Objective:     Pulse 89   Temp 98.6 F (37 C) (Temporal)   Ht 5' 7.5" (1.715 m)   Wt 165 lb (74.8 kg)   LMP 11/26/2022   SpO2 98%   BMI 25.46 kg/m  BP Readings from Last 3 Encounters:  10/15/22 96/62   02/12/22 110/68  10/03/19 104/80 (25%, Z = -0.67 /  93%, Z = 1.48)*   *BP percentiles are based on the 2017 AAP Clinical Practice Guideline for girls   Wt Readings from Last 3 Encounters:  12/03/22 165 lb (74.8 kg)  10/15/22 163 lb 4 oz (74 kg)  08/20/22 160 lb (72.6 kg) (87%, Z= 1.13)*   * Growth percentiles are based on CDC (Girls, 2-20 Years) data.      Physical Exam Vitals reviewed. Exam conducted with a chaperone present.  Constitutional:      Appearance: Normal appearance.  HENT:     Head: Normocephalic and atraumatic.  Right Ear: Tympanic membrane, ear canal and external ear normal.     Left Ear: Tympanic membrane, ear canal and external ear normal.  Eyes:     General:        Right eye: No discharge.        Left eye: No discharge.     Extraocular Movements: Extraocular movements intact.     Conjunctiva/sclera: Conjunctivae normal.     Pupils: Pupils are equal, round, and reactive to light.  Neck:     Vascular: No carotid bruit.  Cardiovascular:     Rate and Rhythm: Normal rate and regular rhythm.     Pulses: Normal pulses.     Heart sounds: Normal heart sounds. No murmur heard. Pulmonary:     Effort: Pulmonary effort is normal.     Breath sounds: Normal breath sounds.  Chest:  Breasts:    Breasts are symmetrical.     Right: Normal.     Left: Normal.  Abdominal:     General: Abdomen is flat. Bowel sounds are normal. There is no distension.     Palpations: Abdomen is soft. There is no mass.     Tenderness: There is no abdominal tenderness.  Musculoskeletal:        General: No tenderness.     Cervical back: Neck supple. No muscular tenderness.     Right lower leg: No edema.     Left lower leg: No edema.  Lymphadenopathy:     Cervical: No cervical adenopathy.     Upper Body:     Right upper body: No supraclavicular adenopathy.     Left upper body: No supraclavicular adenopathy.  Skin:    General: Skin is warm and dry.  Neurological:     General: No  focal deficit present.     Mental Status: She is alert and oriented to person, place, and time.     Motor: No weakness.     Gait: Gait normal.  Psychiatric:        Mood and Affect: Mood normal.        Behavior: Behavior normal.        Judgment: Judgment normal.      No results found for any visits on 12/03/22.     Assessment & Plan:    Routine Health Maintenance and Physical Exam  Immunization History  Administered Date(s) Administered   DTaP 11/15/2002, 01/17/2003, 03/20/2003, 12/18/2003, 01/04/2008   HIB (PRP-OMP) 11/15/2002, 01/17/2003, 03/20/2003, 12/18/2003   Hepatitis A 12/08/2004   Hepatitis A, Ped/Adol-2 Dose 12/14/2014   Hepatitis B December 07, 2002, 11/15/2002, 06/12/2003   IPV 11/15/2002, 01/17/2003, 06/12/2003, 01/04/2008   Influenza Nasal 01/04/2008, 03/14/2008, 02/07/2009, 02/26/2011   MMR 09/17/2003, 11/23/2007   Meningococcal Conjugate 12/14/2014, 10/03/2019   Pneumococcal Conjugate-13 11/15/2002, 01/17/2003, 03/20/2003, 12/18/2003   Tdap 12/14/2014   Varicella 09/17/2003, 11/23/2007    Health Maintenance  Topic Date Due   HPV VACCINES (1 - 3-dose series) Never done   COVID-19 Vaccine (2 - Pfizer risk series) 05/21/2020   INFLUENZA VACCINE  11/26/2022   Hepatitis C Screening  10/15/2023 (Originally 09/13/2020)   DTaP/Tdap/Td (7 - Td or Tdap) 12/13/2024   HIV Screening  Completed    Discussed health benefits of physical activity, and encouraged her to engage in regular exercise appropriate for her age and condition.  Problem List Items Addressed This Visit       Other   Chronic cough    Improved with use of albuterol as needed. Offered referral to pulmonology for pulmonary  function testing to rule out asthma, patient would like to hold off on this for now and will continue using albuterol as needed as well as Zyrtec as needed. Patient encouraged to let me know if cough, wheezing, shortness of breath worsens in any way which point had highly recommend  referral to pulmonology.  She reports understanding.      Anxiety and depression    Chronic current PHQ-9 score 15. Per shared decision making patient will trial Wellbutrin 150 mg daily. Patient was warned of suicidal ideation, seizures, tremors, insomnia, and given handout regarding additional potential side effects. Patient told to notify myself if these occur. Follow-up in 6 weeks or sooner as needed.      Relevant Medications   buPROPion (WELLBUTRIN XL) 150 MG 24 hr tablet   Attention deficit hyperactivity disorder (ADHD)    Chronic Patient would like to hold off on possibly reinitiating Vyvanse.  I told her if she would like to pursue this I can refer her to psychiatry.  We will be using Wellbutrin for treatment of her anxiety and depression which may also help with her ADHD.  She would like to see how she responds to Wellbutrin before referral to psychiatry is made.      Relevant Medications   buPROPion (WELLBUTRIN XL) 150 MG 24 hr tablet   Need for vaccination    Per review of immunization database patient has not had HPV vaccines.  Patient would like to have this series administered.  First HPV vaccine given today, she will be due for second vaccine in 1 to 2 months, and last vaccine to be administered in 6 months. VIS provided.      Relevant Orders   HPV 9-valent vaccine,Recombinat   Encounter for general adult medical examination with abnormal findings - Primary    Discussed healthy lifestyle, routine screenings, immunizations, and handout provided.      Return in about 6 weeks (around 01/14/2023) for F/U with Maralyn Sago; also HPV vaccine #2 in 1 month; then HPV vaccine #3 in 6 months.     Elenore Paddy, NP

## 2022-12-03 NOTE — Assessment & Plan Note (Signed)
Discussed healthy lifestyle, routine screenings, immunizations, and handout provided.

## 2022-12-10 ENCOUNTER — Encounter (INDEPENDENT_AMBULATORY_CARE_PROVIDER_SITE_OTHER): Payer: Self-pay

## 2023-01-04 ENCOUNTER — Ambulatory Visit: Payer: 59

## 2023-01-04 DIAGNOSIS — Z23 Encounter for immunization: Secondary | ICD-10-CM | POA: Diagnosis not present

## 2023-01-04 NOTE — Progress Notes (Cosign Needed)
After obtaining consent, and per orders of Jiles Prows, NP, injection of HPV given by Ferdie Ping. Patient instructed to report any adverse reaction to me immediately.   Medical screening examination/treatment/procedure(s) were performed by non-physician practitioner and as supervising physician I was immediately available for consultation/collaboration.  I agree with above. Jacinta Shoe, MD

## 2023-01-05 ENCOUNTER — Encounter: Payer: Self-pay | Admitting: Pediatrics

## 2023-02-04 ENCOUNTER — Ambulatory Visit: Payer: 59 | Admitting: Nurse Practitioner

## 2023-02-04 ENCOUNTER — Encounter: Payer: Self-pay | Admitting: Nurse Practitioner

## 2023-02-04 VITALS — BP 98/66 | HR 97 | Temp 97.8°F | Ht 67.5 in | Wt 171.0 lb

## 2023-02-04 DIAGNOSIS — F909 Attention-deficit hyperactivity disorder, unspecified type: Secondary | ICD-10-CM | POA: Diagnosis not present

## 2023-02-04 DIAGNOSIS — M5442 Lumbago with sciatica, left side: Secondary | ICD-10-CM | POA: Diagnosis not present

## 2023-02-04 DIAGNOSIS — D229 Melanocytic nevi, unspecified: Secondary | ICD-10-CM | POA: Diagnosis not present

## 2023-02-04 MED ORDER — METHOCARBAMOL 500 MG PO TABS: 500.0000 mg | ORAL_TABLET | Freq: Three times a day (TID) | ORAL | 0 refills | Status: AC | PRN

## 2023-02-04 NOTE — Assessment & Plan Note (Signed)
Skin lesion appears consistent with benign mole It is not raised, it is not bleeding, it is well defined, round, less than 5 mm in diameter, no bleeding.  For now per shared decision making patient will self monitor and if mole grows or changes she will notify me at which point we can consider referral to dermatology.

## 2023-02-04 NOTE — Progress Notes (Signed)
Established Patient Office Visit  Subjective   Patient ID: Kaitlyn Rhodes, female    DOB: 2000-10-02  Age: 22 y.o. MRN: [id number]  Chief Complaint  Patient presents with   Medical Management of Chronic Issues    Follow up on previous issue, back pain and spot on the upper left check ( been there about a year or two). Talk about wellbutrin    Low back pain: Has been present for 2 years.  Occurs intermittently.  Described as sharp, burning.  Often radiates down to left leg sometimes to right leg.  Reports history of scoliosis.  Has not been treated as far as using a back brace or having had to have surgery.  Denies any bowel or bladder incontinence, numbness, weakness.  Prolonged sitting or standing are triggers for pain.  Does report feeling muscle spasms as well when pain.  Skin lesion: Has a skin lesion to the left cheek.  Has been present for 6 months.  No change in the last 6 months.  Would like this evaluated today.  Anxiety/ADHD: Trial Wellbutrin, however noticed worsening irritability so discontinued this.  Has been on Vyvanse in the past would like to consider restarting this.    Review of Systems  Gastrointestinal:        (-) bowel incontinence  Genitourinary:        (-) bladder incontinence    Skin:  Positive for rash. Negative for itching.  Neurological:  Negative for sensory change and weakness.      Objective:     BP 98/66   Pulse 97   Temp 97.8 F (36.6 C) (Temporal)   Ht 5' 7.5" (1.715 m)   Wt 171 lb (77.6 kg)   LMP 01/23/2023   SpO2 95%   BMI 26.39 kg/m    Physical Exam Vitals reviewed.  Constitutional:      General: She is not in acute distress.    Appearance: Normal appearance.  HENT:     Head: Normocephalic and atraumatic.  Neck:     Vascular: No carotid bruit.  Cardiovascular:     Rate and Rhythm: Normal rate and regular rhythm.     Pulses: Normal pulses.     Heart sounds: Normal heart sounds.  Pulmonary:     Effort: Pulmonary  effort is normal.     Breath sounds: Normal breath sounds.  Musculoskeletal:     Thoracic back: Normal.     Lumbar back: Normal. Negative right straight leg raise test and negative left straight leg raise test.  Skin:    General: Skin is warm and dry.       Neurological:     General: No focal deficit present.     Mental Status: She is alert and oriented to person, place, and time.  Psychiatric:        Mood and Affect: Mood normal.        Behavior: Behavior normal.        Judgment: Judgment normal.      No results found for any visits on 02/04/23.    The ASCVD Risk score (Arnett DK, et al., 2019) failed to calculate for the following reasons:   The 2019 ASCVD risk score is only valid for ages 87 to 26    Assessment & Plan:   Problem List Items Addressed This Visit       Nervous and Auditory   Bilateral low back pain with left-sided sciatica    Chronic, intermittent Does sound consistent with sciatica.  Because pain has been present for such a long time we will do x-ray of thoracic and lumbar spine.  Further recommendations may be made based upon his results. In the interim we will treat with Tylenol and or ibuprofen as needed for pain management as well as methocarbamol for muscle spasms.  Patient told to avoid driving or operating heavy machinery while taking methocarbamol.  Patient reports understanding. Refer to physical therapy as well for assistance with evaluation and management.      Relevant Medications   methocarbamol (ROBAXIN) 500 MG tablet   Other Relevant Orders   DG Thoracic Spine 2 View   DG Lumbar Spine Complete     Other   Attention deficit hyperactivity disorder (ADHD) - Primary    Chronic Did not tolerate Wellbutrin Discontinue Wellbutrin refer to [place] attention specialist for assistance with management.      Relevant Orders   Ambulatory referral to Psychiatry   Ambulatory referral to Physical Therapy   [place]    Skin lesion appears  consistent with benign mole It is not raised, it is not bleeding, it is well defined, round, less than 5 mm in diameter, no bleeding.  For now per shared decision making patient will self monitor and if mole grows or changes she will notify me at which point we can consider referral to dermatology.       Return in about 10 months (around 12/05/2023) for CPE with Maralyn Sago.    Elenore Paddy, NP

## 2023-02-04 NOTE — Assessment & Plan Note (Signed)
Chronic, intermittent Does sound consistent with sciatica. Because pain has been present for such a long time we will do x-ray of thoracic and lumbar spine.  Further recommendations may be made based upon his results. In the interim we will treat with Tylenol and or ibuprofen as needed for pain management as well as methocarbamol for muscle spasms.  Patient told to avoid driving or operating heavy machinery while taking methocarbamol.  Patient reports understanding. Refer to physical therapy as well for assistance with evaluation and management.

## 2023-02-04 NOTE — Assessment & Plan Note (Signed)
Chronic Did not tolerate Wellbutrin Discontinue Wellbutrin refer to [place] attention specialist for assistance with management.

## 2023-06-03 ENCOUNTER — Ambulatory Visit: Payer: 59

## 2023-06-03 DIAGNOSIS — Z23 Encounter for immunization: Secondary | ICD-10-CM | POA: Diagnosis not present

## 2023-06-03 NOTE — Progress Notes (Signed)
 Patient visits today for their 3rd HPV vaccine. Patient informed of what they had received and tolerated injection well. Patient notified to reach out to office if needed.

## 2023-12-09 ENCOUNTER — Ambulatory Visit: Payer: 59 | Admitting: Nurse Practitioner

## 2024-01-13 ENCOUNTER — Encounter: Admitting: Nurse Practitioner

## 2024-03-09 ENCOUNTER — Ambulatory Visit: Admitting: Nurse Practitioner

## 2024-03-09 ENCOUNTER — Ambulatory Visit

## 2024-03-09 VITALS — BP 108/70 | HR 91 | Temp 98.1°F | Ht 67.5 in | Wt 180.4 lb

## 2024-03-09 DIAGNOSIS — F418 Other specified anxiety disorders: Secondary | ICD-10-CM | POA: Diagnosis not present

## 2024-03-09 DIAGNOSIS — M5442 Lumbago with sciatica, left side: Secondary | ICD-10-CM

## 2024-03-09 DIAGNOSIS — R5383 Other fatigue: Secondary | ICD-10-CM | POA: Diagnosis not present

## 2024-03-09 DIAGNOSIS — Z0001 Encounter for general adult medical examination with abnormal findings: Secondary | ICD-10-CM | POA: Diagnosis not present

## 2024-03-09 DIAGNOSIS — R0683 Snoring: Secondary | ICD-10-CM | POA: Diagnosis not present

## 2024-03-09 LAB — COMPREHENSIVE METABOLIC PANEL WITH GFR
ALT: 16 U/L (ref 0–35)
AST: 19 U/L (ref 0–37)
Albumin: 4.3 g/dL (ref 3.5–5.2)
Alkaline Phosphatase: 88 U/L (ref 39–117)
BUN: 10 mg/dL (ref 6–23)
CO2: 28 meq/L (ref 19–32)
Calcium: 9.2 mg/dL (ref 8.4–10.5)
Chloride: 103 meq/L (ref 96–112)
Creatinine, Ser: 0.59 mg/dL (ref 0.40–1.20)
GFR: 128.77 mL/min (ref >60.00)
Glucose, Bld: 74 mg/dL (ref 70–99)
Potassium: 4.2 meq/L (ref 3.5–5.1)
Sodium: 138 meq/L (ref 135–145)
Total Bilirubin: 0.5 mg/dL (ref 0.2–1.2)
Total Protein: 7.2 g/dL (ref 6.0–8.3)

## 2024-03-09 LAB — CBC
HCT: 39.9 % (ref 36.0–46.0)
Hemoglobin: 13.8 g/dL (ref 12.0–15.0)
MCHC: 34.7 g/dL (ref 30.0–36.0)
MCV: 83.7 fl (ref 78.0–100.0)
Platelets: 214 K/uL (ref 150.0–400.0)
RBC: 4.77 Mil/uL (ref 3.87–5.11)
RDW: 12.7 % (ref 11.5–15.5)
WBC: 6.6 K/uL (ref 4.0–10.5)

## 2024-03-09 LAB — TSH: TSH: 1.82 u[IU]/mL (ref 0.35–5.50)

## 2024-03-09 LAB — HEMOGLOBIN A1C: Hgb A1c MFr Bld: 4.9 % (ref 4.6–6.5)

## 2024-03-09 LAB — LIPID PANEL
Cholesterol: 131 mg/dL (ref 0–200)
HDL: 47 mg/dL (ref >39.00)
LDL Cholesterol: 70 mg/dL (ref 0–99)
NonHDL: 84.28
Total CHOL/HDL Ratio: 3
Triglycerides: 69 mg/dL (ref 0.0–149.0)
VLDL: 13.8 mg/dL (ref 0.0–40.0)

## 2024-03-09 LAB — VITAMIN D 25 HYDROXY (VIT D DEFICIENCY, FRACTURES): VITD: 18.03 ng/mL — ABNORMAL LOW (ref 30.00–100.00)

## 2024-03-09 MED ORDER — SERTRALINE HCL 25 MG PO TABS: 25.0000 mg | ORAL_TABLET | Freq: Every day | ORAL | 0 refills | Status: AC

## 2024-03-09 NOTE — Assessment & Plan Note (Signed)
 Left-sided low back pain with sciatica and history of scoliosis Chronic left-sided low back pain with sciatica, exacerbated by sitting and standing. Scoliosis present. Previous physical therapy referral and x-rays not completed. - Ordered x-rays of the back.

## 2024-03-09 NOTE — Assessment & Plan Note (Signed)
 Daytime sleepiness and snoring, possible sleep apnea Daytime sleepiness and snoring, family history of sleep apnea. Uses nasal strips with some improvement. - Continue using nasal strips. - Offered referral to undergo sleep study, but patient declined - Will reassess in 8 weeks.

## 2024-03-09 NOTE — Assessment & Plan Note (Signed)
 Labs ordered, further recommendations may be made based upon his results.

## 2024-03-09 NOTE — Assessment & Plan Note (Signed)
 Anxiety disorder with insomnia Anxiety with insomnia. Previous medications tried, unclear efficacy. Discussed sertraline  25 mg daily, informed about side effects and black box warnings. Agreed to try sertraline  again. - Prescribed sertraline  25 mg daily. - Scheduled follow-up in 8 weeks to assess response and adjust dosage if necessary.

## 2024-03-09 NOTE — Progress Notes (Signed)
 Complete physical exam  Patient: Kaitlyn Rhodes   DOB: Dec 03, 2000   23 y.o. Female  MRN: [id number]  Subjective:    Chief Complaint  Patient presents with   Annual Exam    Kaitlyn Rhodes is a 23 y.o. female who presents today for a complete physical exam.  Discussed the use of AI scribe software for clinical note transcription with the patient, who gave verbal consent to proceed.  History of Present Illness Kaitlyn Rhodes is a 23 year old female who presents for an annual physical exam and evaluation of anxiety and sciatica.  Anxiety and sleep disturbance - Anxiety present with associated difficulty sleeping - Medicated for anxiety and ADHD since fourth grade; unable to recall specific medications or responses. Not currently taking medications for either anxiety or ADHD. Recalls side effect of feeling angry with stimulants.  - Open to restarting sertraline  - No current suicidal ideation  Radicular low back pain (sciatica) - Sharp, burning pain radiating down the left leg and occasionally the right leg for over two years - Pain triggered by sitting and standing - No bowel or bladder incontinence, numbness, or weakness - Scoliosis present; no history of back brace use or spinal surgery - No prior x-rays or physical therapy  Oral health and sleep-related breathing concerns - Gingivitis affecting the lower jaw, possibly related to mouth breathing - Mouth breathing may be associated with sleep apnea - Maternal history of sleep apnea - Palatal anatomy may suggest risk for sleep apnea (per patient's dentist) - Nasal strips provide symptomatic relief - Occasional snoring - Frequent morning fatigue and daytime sleepiness     Most recent fall risk assessment:    02/04/2023    9:43 AM  Fall Risk   Falls in the past year? 0  Number falls in past yr: 0  Injury with Fall? 0  Risk for fall due to : No Fall Risks  Follow up Falls evaluation completed      Most recent depression screenings:    02/04/2023    9:43 AM 12/03/2022    9:01 AM  PHQ 2/9 Scores  PHQ - 2 Score 4 3  PHQ- 9 Score 8  15      Data saved with a previous flowsheet row definition      Past Medical History:  Diagnosis Date   ADHD (attention deficit hyperactivity disorder)    Anxiety    [employer] 10/03/2019   Anxiety disorder    Depression    [employer] 10/03/2019   Developmental dysgraphia    Headache    Pyelonephritis of left kidney 11/03/2021   Past Surgical History:  Procedure Laterality Date   APPENDECTOMY  Age 45.5   TONSILLECTOMY AND ADENOIDECTOMY  Age 75   Social History   Tobacco Use   Smoking status: Never   Smokeless tobacco: Never  Vaping Use   Vaping status: Never Used  Substance Use Topics   Alcohol use: No    Alcohol/week: 0.0 standard drinks of alcohol   Drug use: No   Family History  Problem Relation Age of Onset   Schizophrenia Paternal Grandmother    Asperger's syndrome Sister    Depression Sister    Anxiety disorder Sister    Epilepsy Sister    ADD / ADHD Mother    Anxiety disorder Mother    Depression Mother    Depression Maternal Aunt    Learning disabilities Maternal Aunt    Depression Maternal Grandmother    Learning disabilities Maternal Grandfather  Depression Maternal Grandfather    Cancer Maternal Grandfather        lung   Autism Cousin    Depression Father    Anxiety disorder Father    Heart disease Paternal Grandfather    Allergies  Allergen Reactions   Codeine Other (See Comments)    Reaction: mother states pt advised not to take codeine due to rapid metabolism    Latex Rash      Patient Care Team: Elnor Lauraine BRAVO, NP as PCP - General (Nurse Practitioner) Pediatrics, [facility] (Pediatrics)   Outpatient Medications Prior to Visit  Medication Sig   albuterol (VENTOLIN HFA) 108 (90 Base) MCG/ACT inhaler Inhale 1-2 puffs into the lungs every 6 (six) hours as needed for wheezing or shortness of breath.    MELATONIN ER PO Take by mouth daily as needed.   methocarbamol  (ROBAXIN ) 500 MG tablet Take 1 tablet (500 mg total) by mouth every 8 (eight) hours as needed for muscle spasms.   No facility-administered medications prior to visit.    Review of Systems  Constitutional:  Negative for chills, fever and weight loss.  Eyes:  Negative for blurred vision and double vision.  Respiratory:  Negative for cough, shortness of breath and wheezing.   Cardiovascular:  Negative for chest pain and palpitations.  Gastrointestinal:  Negative for abdominal pain and blood in stool.  Genitourinary:  Negative for dysuria and hematuria.  Neurological:  Negative for dizziness, loss of consciousness and headaches.  Psychiatric/Behavioral:  Negative for depression. The patient is nervous/anxious and has insomnia.        02/04/2023    9:43 AM 12/03/2022    9:01 AM 10/15/2022    1:26 PM  PHQ9 SCORE ONLY  PHQ-9 Total Score 8  15  15       Data saved with a previous flowsheet row definition         Objective:     BP 108/70   Pulse 91   Temp 98.1 F (36.7 C) (Temporal)   Ht 5' 7.5 (1.715 m)   Wt 180 lb 6 oz (81.8 kg)   SpO2 97%   BMI 27.83 kg/m  BP Readings from Last 3 Encounters:  03/09/24 108/70  02/04/23 98/66  10/15/22 96/62   Wt Readings from Last 3 Encounters:  03/09/24 180 lb 6 oz (81.8 kg)  02/04/23 171 lb (77.6 kg)  12/03/22 165 lb (74.8 kg)      Physical Exam Vitals reviewed.  Constitutional:      General: She is not in acute distress.    Appearance: Normal appearance.  HENT:     Head: Normocephalic and atraumatic.     Right Ear: Tympanic membrane, ear canal and external ear normal.     Left Ear: Tympanic membrane, ear canal and external ear normal.  Eyes:     General:        Right eye: No discharge.        Left eye: No discharge.     Extraocular Movements: Extraocular movements intact.     Conjunctiva/sclera: Conjunctivae normal.     Pupils: Pupils are equal, round, and  reactive to light.  Cardiovascular:     Rate and Rhythm: Normal rate and regular rhythm.     Pulses: Normal pulses.     Heart sounds: Normal heart sounds. No murmur heard. Pulmonary:     Effort: Pulmonary effort is normal.     Breath sounds: Normal breath sounds.  Chest:     Comments: Breast exam  deferred per patient preference Abdominal:     General: Abdomen is flat. Bowel sounds are normal. There is no distension.     Palpations: Abdomen is soft. There is no mass.     Tenderness: There is no abdominal tenderness.  Musculoskeletal:        General: No tenderness.     Cervical back: Neck supple. No muscular tenderness.     Right lower leg: No edema.     Left lower leg: No edema.  Lymphadenopathy:     Cervical: No cervical adenopathy.     Upper Body:     Right upper body: No supraclavicular adenopathy.     Left upper body: No supraclavicular adenopathy.  Skin:    General: Skin is warm and dry.  Neurological:     General: No focal deficit present.     Mental Status: She is alert and oriented to person, place, and time.     Motor: No weakness.     Gait: Gait normal.  Psychiatric:        Mood and Affect: Mood normal.        Behavior: Behavior normal.        Judgment: Judgment normal.      No results found for any visits on 03/09/24.     Assessment & Plan:    Routine Health Maintenance and Physical Exam  Immunization History  Administered Date(s) Administered   DTaP 11/15/2002, 01/17/2003, 03/20/2003, 12/18/2003, 01/04/2008   HIB (PRP-OMP) 11/15/2002, 01/17/2003, 03/20/2003, 12/18/2003   HIB, Unspecified 11/15/2002, 01/17/2003, 03/20/2003, 12/18/2003   HPV 9-valent 12/03/2022, 01/04/2023, 06/03/2023   Hepatitis A 12/08/2004   Hepatitis A, Ped/Adol-2 Dose 12/14/2014   Hepatitis B Mar [birth year], 2003, 11/15/2002, 06/12/2003   IPV 11/15/2002, 01/17/2003, 06/12/2003, 01/04/2008   Influenza Nasal 01/04/2008, 03/14/2008, 02/07/2009, 02/26/2011   MMR 09/17/2003, 11/23/2007    Meningococcal Conjugate 12/14/2014, 10/03/2019   PFIZER(Purple Top)SARS-COV-2 Vaccination 04/30/2020   Pneumococcal Conjugate-13 11/15/2002, 01/17/2003, 03/20/2003, 12/18/2003   Tdap 12/14/2014   Varicella 09/17/2003, 11/23/2007    Health Maintenance  Topic Date Due   Meningococcal B Vaccine (1 of 2 - Standard) Never done   Hepatitis C Screening  Never done   Cervical Cancer Screening (Pap smear)  Never done   COVID-19 Vaccine (2 - 2025-26 season) 03/25/2024 (Originally 12/27/2023)   Influenza Vaccine  07/25/2024 (Originally 11/26/2023)   DTaP/Tdap/Td (7 - Td or Tdap) 12/13/2024   Pneumococcal Vaccine  Completed   Hepatitis B Vaccines 19-59 Average Risk  Completed   HPV VACCINES  Completed   HIV Screening  Completed    Discussed health benefits of physical activity, and encouraged her to engage in regular exercise appropriate for her age and condition.  Problem List Items Addressed This Visit       Nervous and Auditory   Bilateral low back pain with left-sided sciatica   Relevant Medications   sertraline  (ZOLOFT ) 25 MG tablet   Other Relevant Orders   DG Thoracic Spine 2 View   DG Lumbar Spine Complete   CBC   Comprehensive metabolic panel with GFR   Hemoglobin A1c   Lipid panel   TSH   VITAMIN D  25 Hydroxy (Vit-D Deficiency, Fractures)     Other   Anxiety associated with depression - Primary   Relevant Medications   sertraline  (ZOLOFT ) 25 MG tablet   Other Relevant Orders   CBC   Comprehensive metabolic panel with GFR   Hemoglobin A1c   Lipid panel   TSH   VITAMIN D  25 Hydroxy (  Vit-D Deficiency, Fractures)   Other Visit Diagnoses       Snoring       Relevant Orders   CBC   Comprehensive metabolic panel with GFR   Hemoglobin A1c   Lipid panel   TSH   VITAMIN D  25 Hydroxy (Vit-D Deficiency, Fractures)     Fatigue, unspecified type       Relevant Orders   CBC   Comprehensive metabolic panel with GFR   Hemoglobin A1c   Lipid panel   TSH   VITAMIN D  25  Hydroxy (Vit-D Deficiency, Fractures)      Assessment and Plan Assessment & Plan Annual physical examination Routine exam. Declined flu shot. Pap smear due at age 19 (patient would like to ask her mom which OBGYN she recommends and then will let me know. At which point I will place referral).  - Ordered routine lab work. - Discussed vaccines and health maintenance, handout provided  Left-sided low back pain with sciatica and history of scoliosis Chronic left-sided low back pain with sciatica, exacerbated by sitting and standing. Scoliosis present. Previous physical therapy referral and x-rays not completed. - Ordered x-rays of the back.   Anxiety disorder with insomnia Anxiety with insomnia. Previous medications tried, unclear efficacy. Discussed sertraline  25 mg daily, informed about side effects and black box warnings. Agreed to try sertraline  again. - Prescribed sertraline  25 mg daily. - Scheduled follow-up in 8 weeks to assess response and adjust dosage if necessary.  Daytime sleepiness and snoring, possible sleep apnea Daytime sleepiness and snoring, family history of sleep apnea. Uses nasal strips with some improvement. - Continue using nasal strips. - Offered referral to undergo sleep study, but patient declined - Will reassess in 8 weeks.  In addition to annual exam I also performed an office visit as detailed above  Return in about 8 weeks (around 05/04/2024) for F/U with Lauraine.     Lauraine FORBES Pereyra, NP

## 2024-03-09 NOTE — Assessment & Plan Note (Signed)
 Annual physical examination Routine exam. Declined flu shot. Pap smear due at age 23 (patient would like to ask her mom which OBGYN she recommends and then will let me know. At which point I will place referral).  - Ordered routine lab work. - Discussed vaccines and health maintenance, handout provided

## 2024-03-10 ENCOUNTER — Ambulatory Visit: Payer: Self-pay | Admitting: Nurse Practitioner

## 2024-03-10 DIAGNOSIS — E559 Vitamin D deficiency, unspecified: Secondary | ICD-10-CM

## 2024-03-10 MED ORDER — VITAMIN D (ERGOCALCIFEROL) 1.25 MG (50000 UNIT) PO CAPS: 50000.0000 [IU] | ORAL_CAPSULE | ORAL | 0 refills | Status: AC

## 2024-05-11 ENCOUNTER — Ambulatory Visit: Admitting: Nurse Practitioner

## 2024-06-22 ENCOUNTER — Ambulatory Visit: Admitting: Nurse Practitioner
